# Patient Record
Sex: Male | Born: 1980 | Race: White | Hispanic: No | Marital: Married | Smoking: Current every day smoker
Health system: Southern US, Community
[De-identification: ages and names within clinical notes are randomized; demographics above are authoritative.]

## PROBLEM LIST (undated history)

## (undated) DIAGNOSIS — B019 Varicella without complication: Secondary | ICD-10-CM

## (undated) DIAGNOSIS — K921 Melena: Secondary | ICD-10-CM

## (undated) HISTORY — DX: Melena: K92.1

## (undated) HISTORY — DX: Varicella without complication: B01.9

---

## 2006-08-21 ENCOUNTER — Emergency Department (HOSPITAL_COMMUNITY): Admission: EM | Admit: 2006-08-21 | Discharge: 2006-08-21 | Payer: Self-pay | Admitting: Emergency Medicine

## 2006-08-21 ENCOUNTER — Ambulatory Visit (HOSPITAL_COMMUNITY): Admission: RE | Admit: 2006-08-21 | Discharge: 2006-08-21 | Payer: Self-pay | Admitting: Emergency Medicine

## 2008-02-21 IMAGING — CT CT UROGRAM
1 of 2 series · 15 of 32 positions shown, 20 images · non-contrast
Comparison: NONE

CLINICAL DATA: Hematuria. 

CT UROGRAM
TECHNIQUE: Thin-section unenhanced axial images were obtained to 
provide a CT urogram to evaluate for possible urinary tract stone. 
 Scan thicknesses were 3 mm with 3-mm increments.

[Series 2: wo · axial · 0.75mm/px · z∈[+1056,+1428]mm · 15 of 140 slices shown, 20 images]
[im 8/140  soft-tissue]
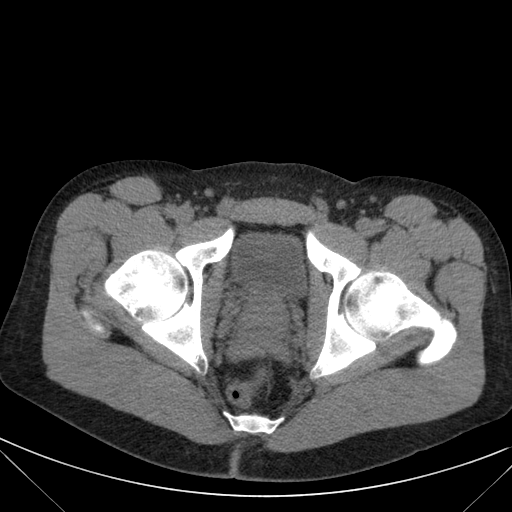
[im 8/140  bone]
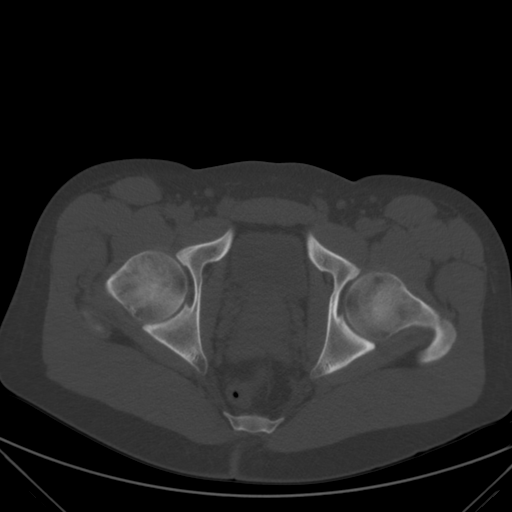
[im 15/140  soft-tissue]
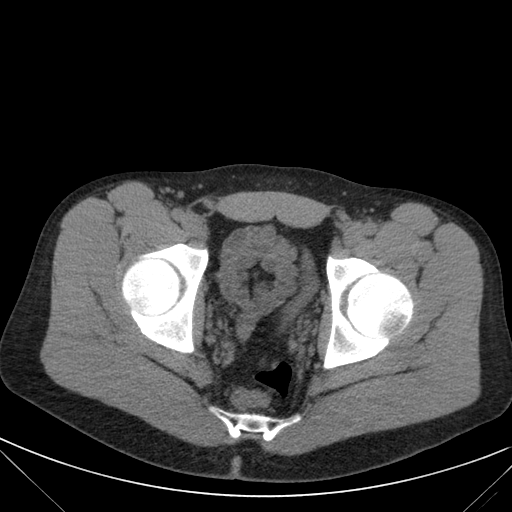
[im 30/140  soft-tissue]
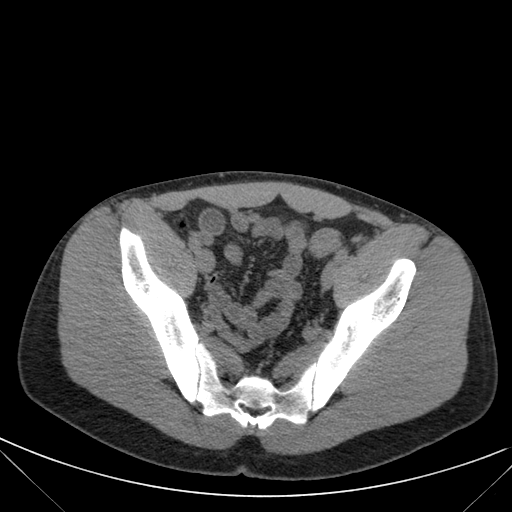
[im 37/140  soft-tissue]
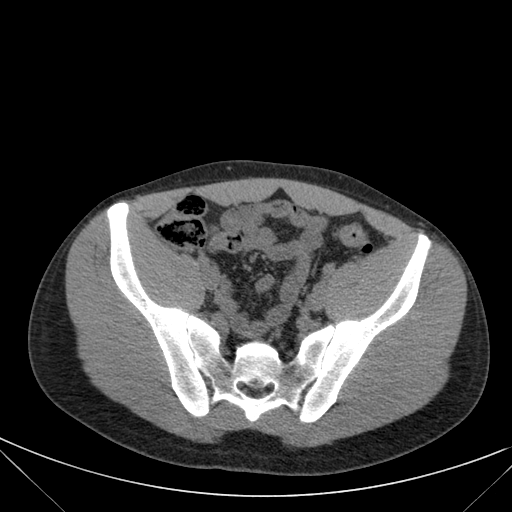
[im 44/140  soft-tissue]
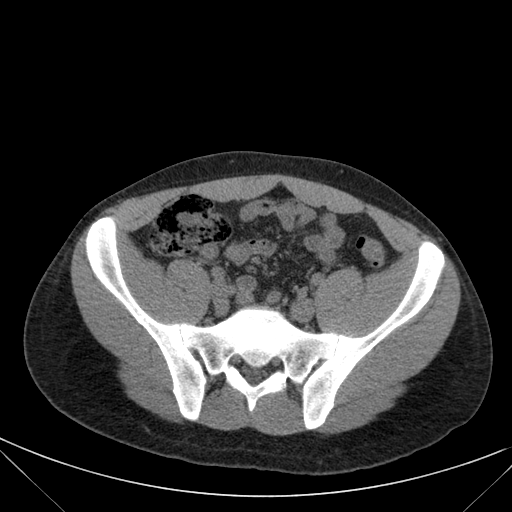
[im 59/140  soft-tissue]
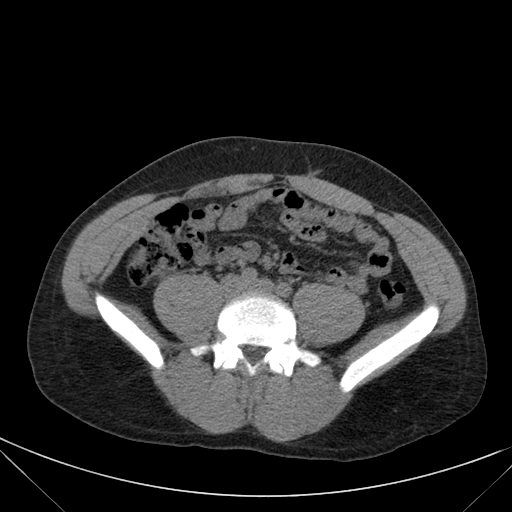
[im 66/140  soft-tissue]
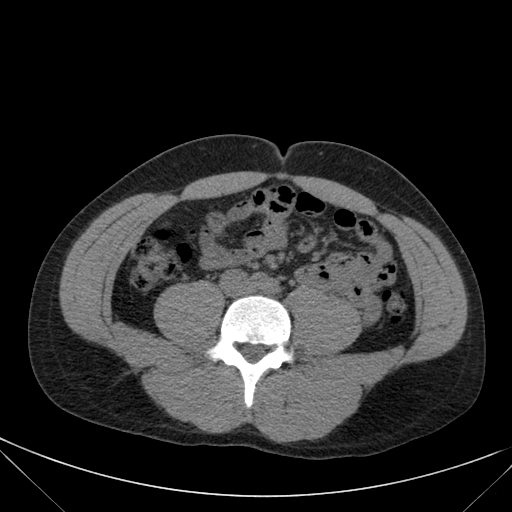
[im 74/140  soft-tissue]
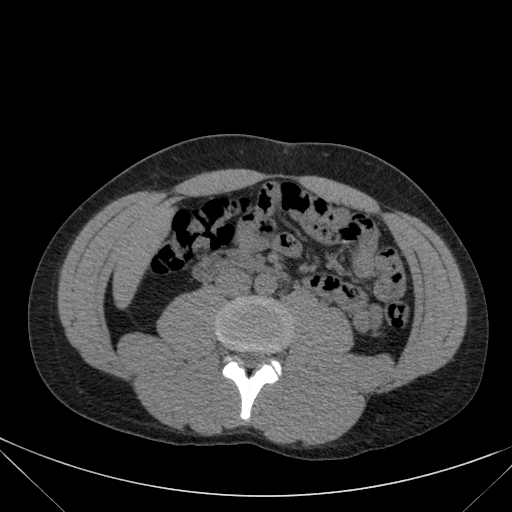
[im 81/140  soft-tissue]
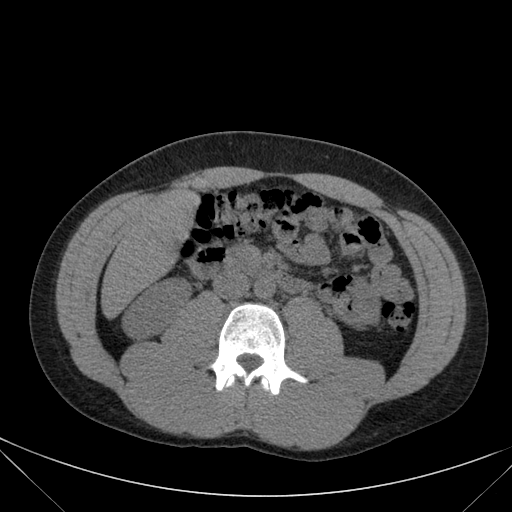
[im 81/140  bone]
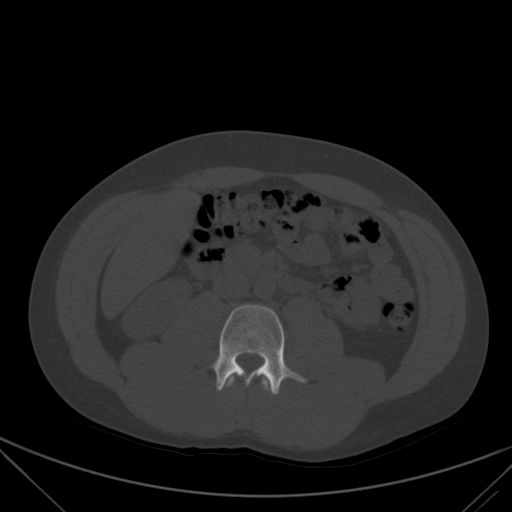
[im 96/140  soft-tissue]
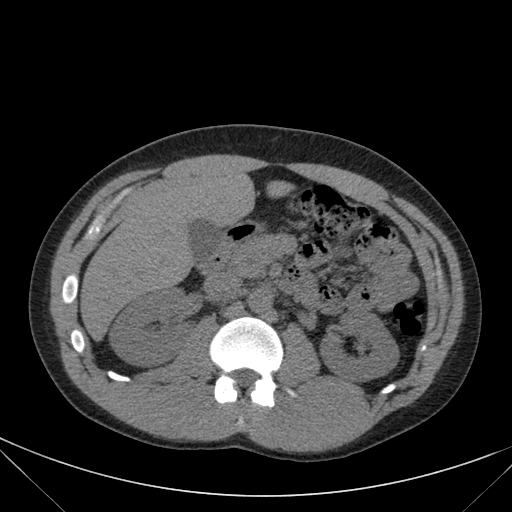
[im 103/140  soft-tissue]
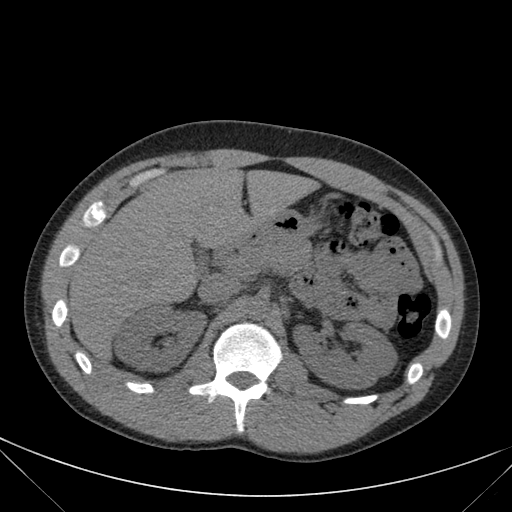
[im 110/140  soft-tissue]
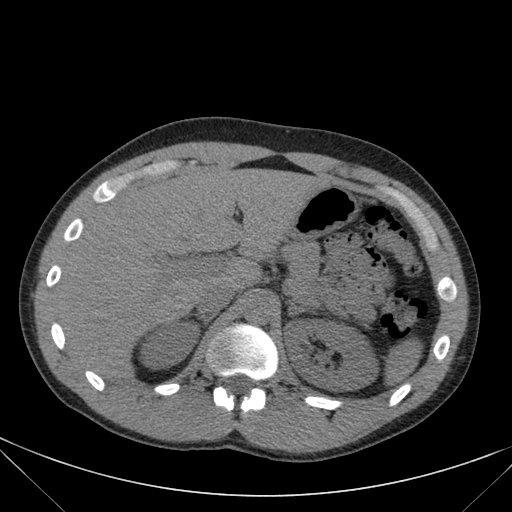
[im 110/140  lung]
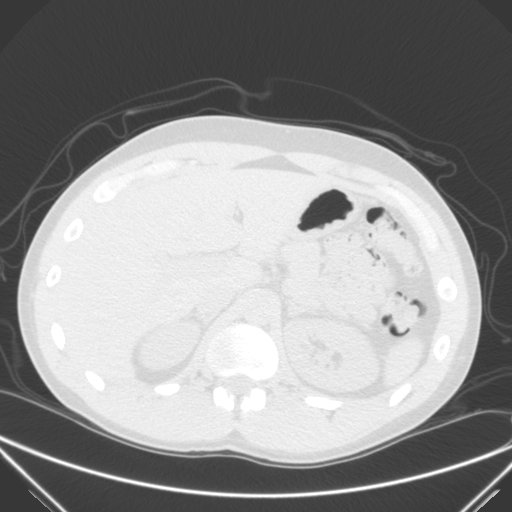
[im 118/140  lung]
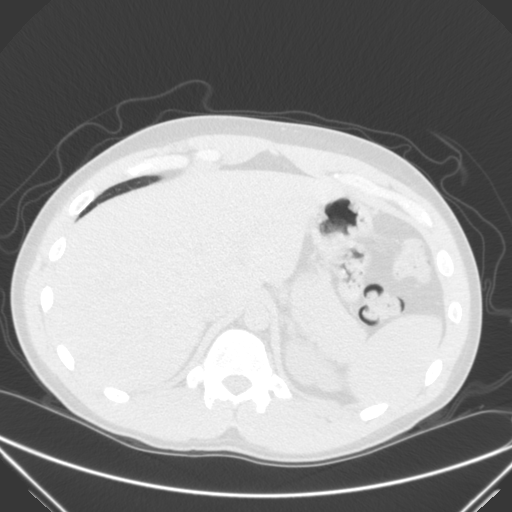
[im 125/140  soft-tissue]
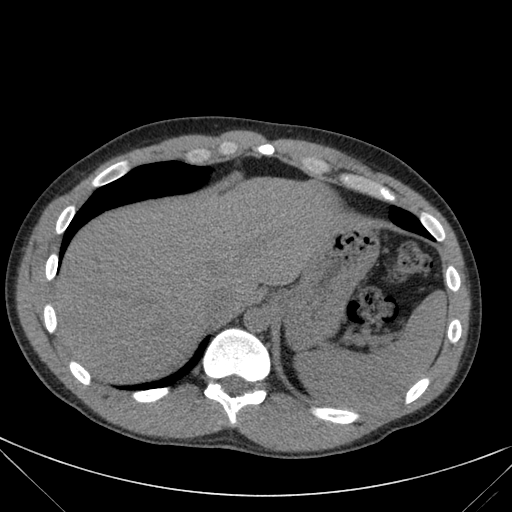
[im 125/140  lung]
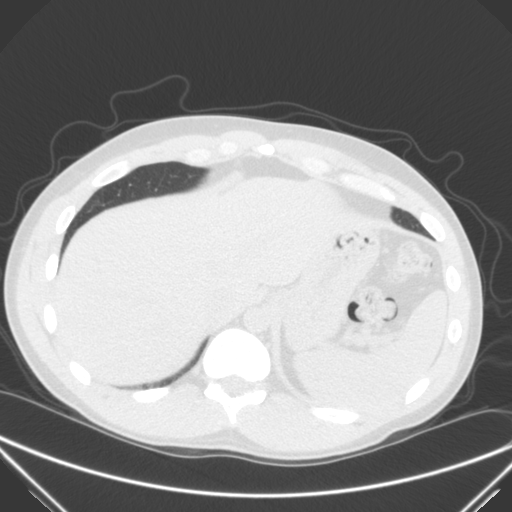
[im 132/140  soft-tissue]
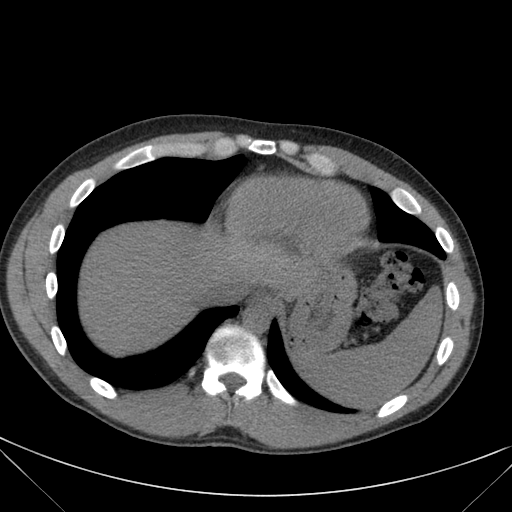
[im 132/140  lung]
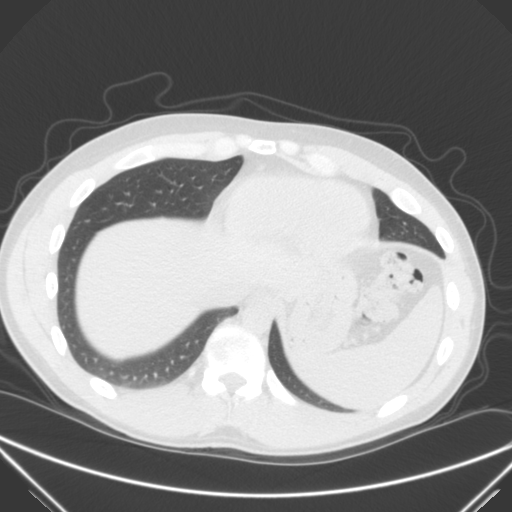

[15 of 32 positions shown; findings below may reference images not displayed]

FINDINGS: Kidneys are normal in size, location, and density.  The 
left kidney has no visible calculi.  The right kidney has a 3-4 
mm-long calculus in the renal pelvis, nonobstructing currently.  
There is also a 1-mm stone in the lower pole calyx on the right 
side.  No perinephric stranding or periureteric stranding or free 
fluid evident.  The bladder, seminal vesicles, and prostate are 
unremarkable.  Bowel and mesentery and aorta show nothing unusual.
IMPRESSION: Nonobstructing calculus in the right renal pelvis and 
lower pole calyx. Fattbardha Bagislar, M.D. Electronically 
[facility]  [facility]

## 2011-02-05 ENCOUNTER — Ambulatory Visit (INDEPENDENT_AMBULATORY_CARE_PROVIDER_SITE_OTHER): Payer: PRIVATE HEALTH INSURANCE

## 2011-02-05 ENCOUNTER — Inpatient Hospital Stay (INDEPENDENT_AMBULATORY_CARE_PROVIDER_SITE_OTHER)
Admission: RE | Admit: 2011-02-05 | Discharge: 2011-02-05 | Disposition: A | Discharge status: Home / Self Care | Payer: PRIVATE HEALTH INSURANCE | Source: Ambulatory Visit | Attending: Family Medicine | Admitting: Family Medicine

## 2011-02-05 DIAGNOSIS — M79609 Pain in unspecified limb: Secondary | ICD-10-CM

## 2011-12-08 IMAGING — CR DG CHEST 2V
2 series · 2 of 2 positions shown · non-contrast
Comparison: None.

CLINICAL DATA: Pain

CHEST - 2 VIEW

[view not recorded (1 of 2)]
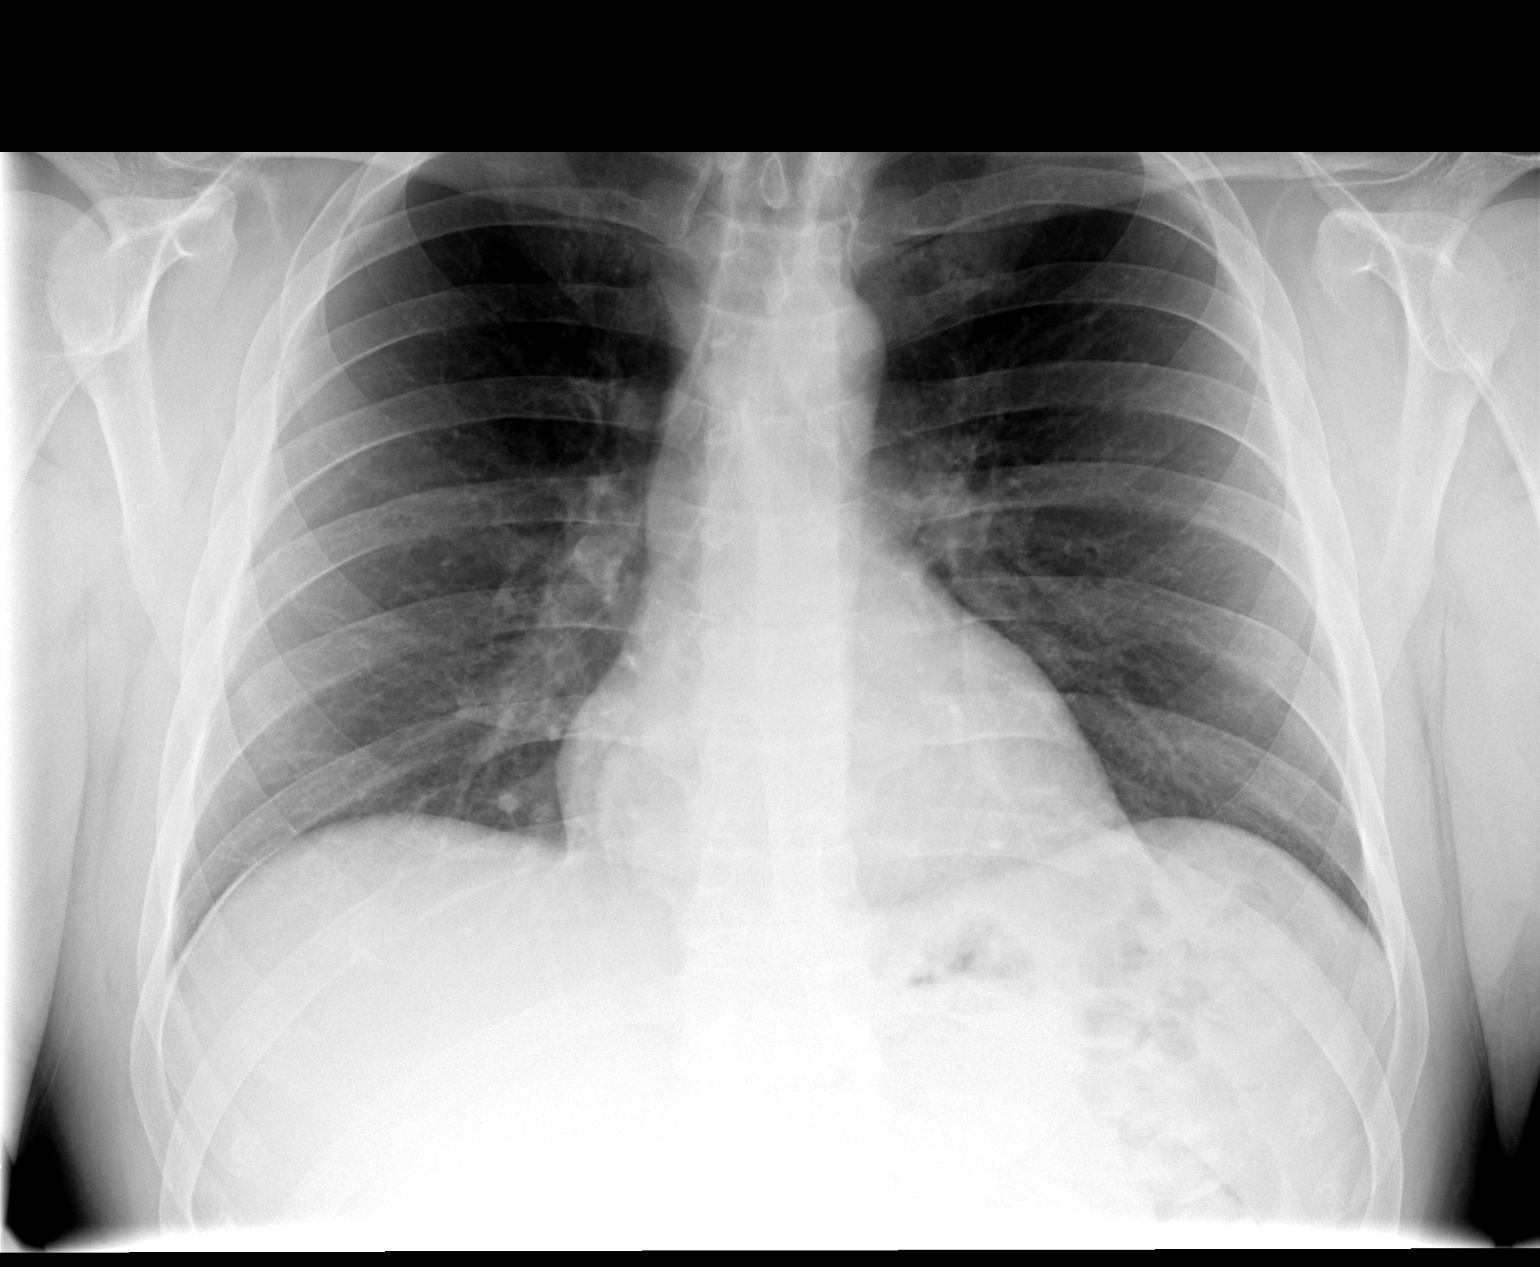

[view not recorded (2 of 2)]
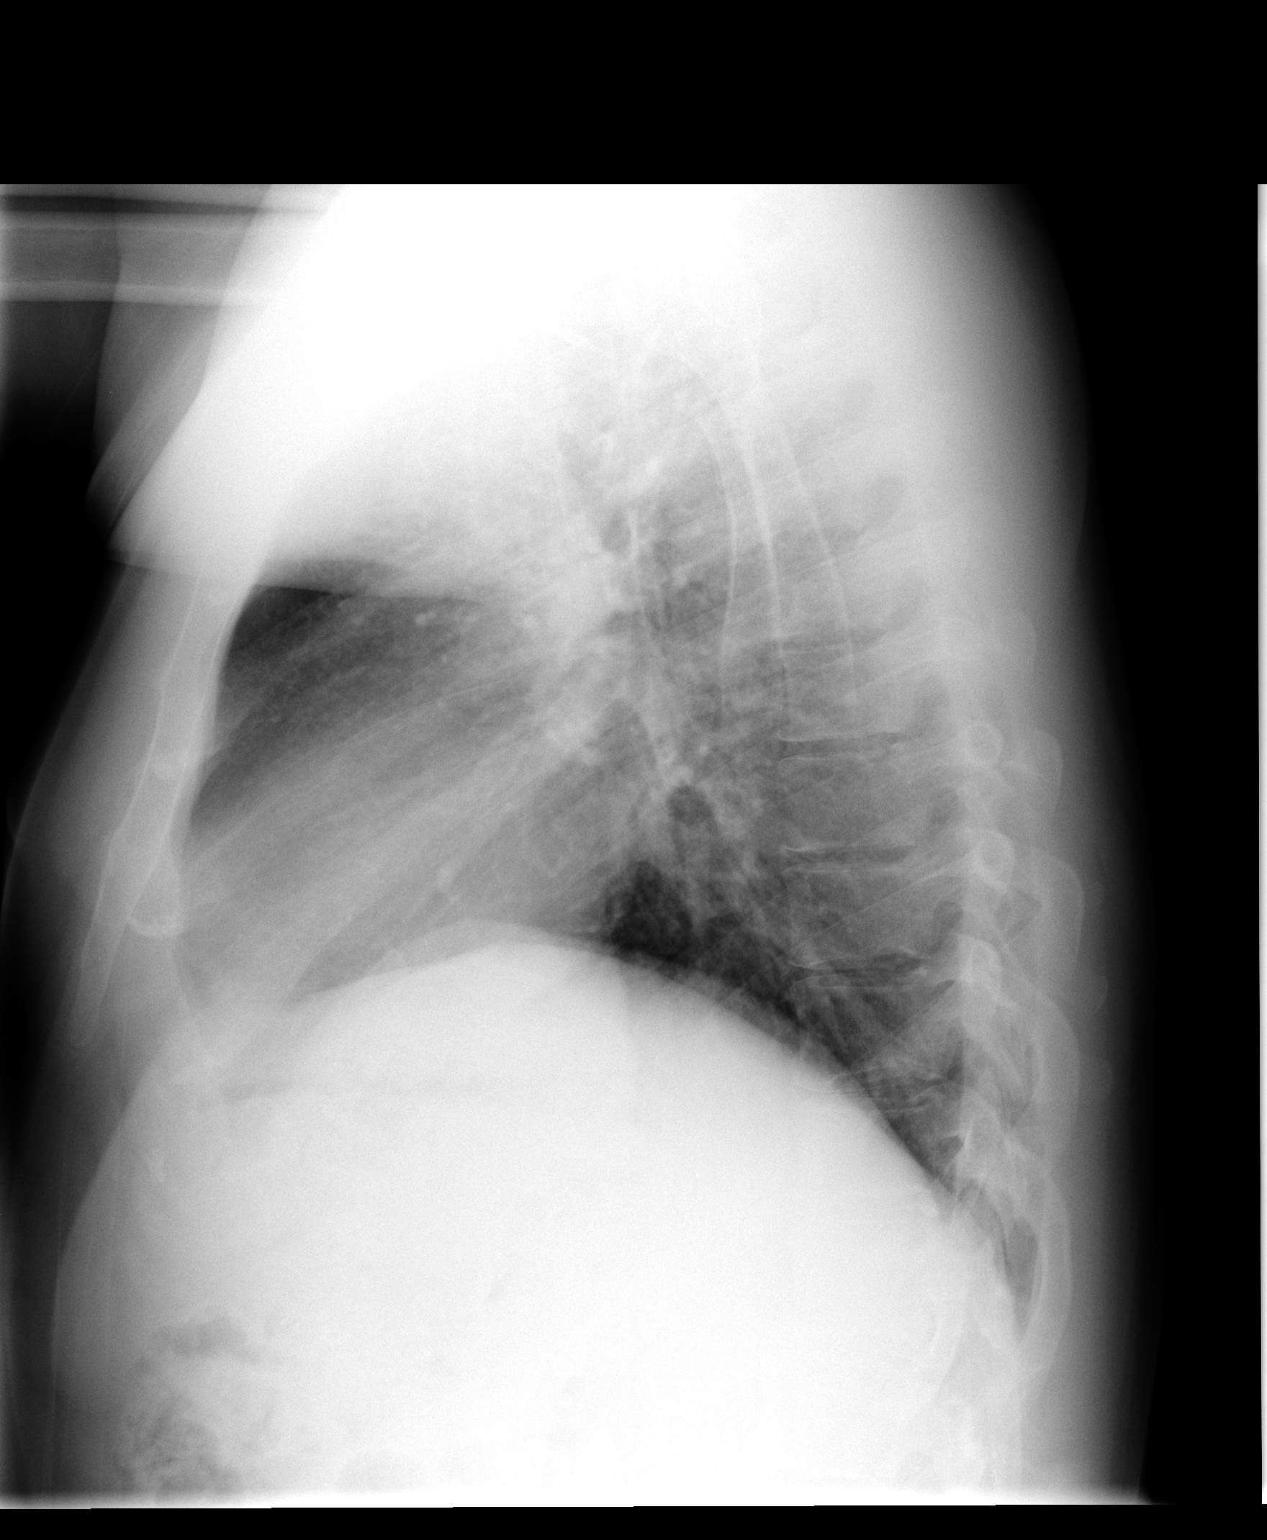

[2 of 2 positions shown; findings below may reference images not displayed]

FINDINGS: Cardiomediastinal silhouette is within normal limits. The
lungs are clear. No pleural effusion.  No pneumothorax.  No acute
osseous abnormality.
IMPRESSION: Normal chest.

## 2017-09-11 ENCOUNTER — Ambulatory Visit: Payer: BLUE CROSS/BLUE SHIELD | Admitting: Adult Health

## 2017-09-11 ENCOUNTER — Encounter: Payer: Self-pay | Admitting: Adult Health

## 2017-09-11 VITALS — BP 118/84 | Temp 98.1°F | Ht 72.0 in | Wt 220.0 lb

## 2017-09-11 DIAGNOSIS — Z3141 Encounter for fertility testing: Secondary | ICD-10-CM | POA: Diagnosis not present

## 2017-09-11 DIAGNOSIS — Z720 Tobacco use: Secondary | ICD-10-CM | POA: Diagnosis not present

## 2017-09-11 DIAGNOSIS — Z23 Encounter for immunization: Secondary | ICD-10-CM | POA: Diagnosis not present

## 2017-09-11 DIAGNOSIS — Z0000 Encounter for general adult medical examination without abnormal findings: Secondary | ICD-10-CM | POA: Diagnosis not present

## 2017-09-11 LAB — CBC WITH DIFFERENTIAL/PLATELET
BASOS ABS: 0 10*3/uL (ref 0.0–0.1)
Basophils Relative: 0.5 % (ref 0.0–3.0)
EOS PCT: 5.4 % — AB (ref 0.0–5.0)
Eosinophils Absolute: 0.4 10*3/uL (ref 0.0–0.7)
HCT: 48.5 % (ref 39.0–52.0)
HEMOGLOBIN: 15.8 g/dL (ref 13.0–17.0)
Lymphocytes Relative: 32.4 % (ref 12.0–46.0)
Lymphs Abs: 2.4 10*3/uL (ref 0.7–4.0)
MCHC: 32.7 g/dL (ref 30.0–36.0)
MCV: 94.4 fl (ref 78.0–100.0)
MONOS PCT: 8.7 % (ref 3.0–12.0)
Monocytes Absolute: 0.6 10*3/uL (ref 0.1–1.0)
Neutro Abs: 3.9 10*3/uL (ref 1.4–7.7)
Neutrophils Relative %: 53 % (ref 43.0–77.0)
Platelets: 284 10*3/uL (ref 150.0–400.0)
RBC: 5.14 Mil/uL (ref 4.22–5.81)
RDW: 12.6 % (ref 11.5–15.5)
WBC: 7.4 10*3/uL (ref 4.0–10.5)

## 2017-09-11 LAB — HEPATIC FUNCTION PANEL
ALBUMIN: 4.8 g/dL (ref 3.5–5.2)
ALK PHOS: 61 U/L (ref 39–117)
ALT: 16 U/L (ref 0–53)
AST: 15 U/L (ref 0–37)
BILIRUBIN TOTAL: 0.4 mg/dL (ref 0.2–1.2)
Bilirubin, Direct: 0.1 mg/dL (ref 0.0–0.3)
Total Protein: 7.4 g/dL (ref 6.0–8.3)

## 2017-09-11 LAB — BASIC METABOLIC PANEL
BUN: 15 mg/dL (ref 6–23)
CALCIUM: 9.4 mg/dL (ref 8.4–10.5)
CO2: 27 mEq/L (ref 19–32)
Chloride: 104 mEq/L (ref 96–112)
Creatinine, Ser: 0.84 mg/dL (ref 0.40–1.50)
GFR: 110.09 mL/min (ref >60.00)
GLUCOSE: 93 mg/dL (ref 70–99)
POTASSIUM: 4.4 meq/L (ref 3.5–5.1)
SODIUM: 142 meq/L (ref 135–145)

## 2017-09-11 LAB — TSH: TSH: 1.37 u[IU]/mL (ref 0.35–4.50)

## 2017-09-11 LAB — LDL CHOLESTEROL, DIRECT: LDL DIRECT: 102 mg/dL

## 2017-09-11 LAB — LIPID PANEL
Cholesterol: 173 mg/dL (ref 0–200)
HDL: 46.1 mg/dL (ref >39.00)
NONHDL: 126.68
Total CHOL/HDL Ratio: 4
Triglycerides: 265 mg/dL — ABNORMAL HIGH (ref 0.0–149.0)
VLDL: 53 mg/dL — AB (ref 0.0–40.0)

## 2017-09-11 LAB — HEMOGLOBIN A1C: Hgb A1c MFr Bld: 5.7 % (ref 4.6–6.5)

## 2017-09-11 MED ORDER — VARENICLINE TARTRATE 0.5 MG X 11 & 1 MG X 42 PO MISC: ORAL | 0 refills | Status: AC

## 2017-09-11 MED ORDER — TRIAMCINOLONE ACETONIDE 0.5 % EX CREA: 1.0000 "application " | TOPICAL_CREAM | Freq: Two times a day (BID) | CUTANEOUS | 3 refills | Status: AC

## 2017-09-11 NOTE — Patient Instructions (Signed)
It was great meeting you today   We will call you about your blood work   Someone from Urology will call you to schedule your visit   Please work on cutting back on alcohol consumption and smoking.   Follow up with me in one month after starting chantix

## 2017-09-11 NOTE — Progress Notes (Signed)
Patient presents to clinic today to establish care. He is a pleasant 37 year old male who  has a past medical history of Blood in stool and Chicken pox.   Acute Concerns: Establish Care/CPE   Chronic Issues: Alcohol use - drinks about 30 cans of beer a week.   Tobacco Use- smokes about 0.5 packs per day. He is interested in quitting smoking. Has not tried anything in the past   Fertility testing - he and his wife have been trying to get pregnant for one year without success. He would like to be tested.    Health Maintenance: Dental -- Starting Routine Care  Vision -- Does not do routine care  Immunizations --Needs Tetanus  Colonoscopy -- Never had  Diet: Has been eating grilled and sauteed foods.  Exercise: Does not exercise outside of work   History reviewed. No pertinent surgical history.  No current outpatient medications on file prior to visit.   No current facility-administered medications on file prior to visit.     No Known Allergies  Family History  Problem Relation Age of Onset  . Colon cancer Paternal Uncle   . Cystic fibrosis Sister   . Colon cancer Other        Paternal great grandfather     Social History   Socioeconomic History  . Marital status: Single    Spouse name: Not on file  . Number of children: Not on file  . Years of education: Not on file  . Highest education level: Not on file  Social Needs  . Financial resource strain: Not on file  . Food insecurity - worry: Not on file  . Food insecurity - inability: Not on file  . Transportation needs - medical: Not on file  . Transportation needs - non-medical: Not on file  Occupational History  . Not on file  Tobacco Use  . Smoking status: Current Every Day Smoker    Packs/day: 0.50    Types: Cigarettes  . Smokeless tobacco: Never Used  Substance and Sexual Activity  . Alcohol use: Yes    Alcohol/week: 18.0 oz    Types: 30 Cans of beer per week  . Drug use: No  . Sexual activity:  Not on file  Other Topics Concern  . Not on file  Social History Medical sales representative at [employer]    Married    No kids     Review of Systems  Constitutional: Negative.   HENT: Negative.   Eyes: Negative.   Respiratory: Negative.   Cardiovascular: Negative.   Gastrointestinal: Negative.   Genitourinary: Negative.   Musculoskeletal: Negative.   Skin: Negative.   Neurological: Negative.   Endo/Heme/Allergies: Negative.   Psychiatric/Behavioral: Negative.   All other systems reviewed and are negative.   BP 118/84 (BP Location: Left Arm)   Temp 98.1 F (36.7 C) (Oral)   Ht 6' (1.829 m)   Wt 220 lb (99.8 kg)   BMI 29.84 kg/m   Physical Exam  Constitutional: He is oriented to person, place, and time and well-developed, well-nourished, and in no distress. No distress.  HENT:  Head: Normocephalic and atraumatic.  Right Ear: External ear normal.  Left Ear: External ear normal.  Nose: Nose normal.  Mouth/Throat: Oropharynx is clear and moist. No oropharyngeal exudate.  Eyes: Conjunctivae and EOM are normal. Pupils are equal, round, and reactive to light. Right eye exhibits no discharge. Left eye exhibits no discharge. No scleral icterus.  Neck: Normal range of  motion. Neck supple. No JVD present. No tracheal deviation present. No thyromegaly present.  Cardiovascular: Normal rate, regular rhythm, normal heart sounds and intact distal pulses. Exam reveals no gallop and no friction rub.  No murmur heard. Pulmonary/Chest: Effort normal and breath sounds normal. No stridor. No respiratory distress. He has no wheezes. He has no rales. He exhibits no tenderness.  Abdominal: Soft. Bowel sounds are normal. He exhibits no distension and no mass. There is no tenderness. There is no rebound and no guarding.  Musculoskeletal: Normal range of motion. He exhibits no edema, tenderness or deformity.  Lymphadenopathy:    He has no cervical adenopathy.  Neurological: He is alert and oriented to  person, place, and time. He displays normal reflexes. No cranial nerve deficit. He exhibits normal muscle tone. Gait normal. Coordination normal. GCS score is 15.  Skin: Skin is warm and dry. No rash noted. He is not diaphoretic. No erythema. No pallor.  Psychiatric: Mood, memory, affect and judgment normal.  Nursing note and vitals reviewed.  Assessment/Plan: 1. Tobacco use - varenicline (CHANTIX STARTING MONTH PAK) 0.5 MG X 11 & 1 MG X 42 tablet; Take one 0.5 mg tablet by mouth once daily for 3 days, then increase to one 0.5 mg tablet twice daily for 4 days, then increase to one 1 mg tablet twice daily.  Dispense: 53 tablet; Refill: 0  Trial of chantix. Common side effects including rare risk of suicide ideation was discussed with the patient today.  Patient is instructed to go directly to the ED if this occurs.  We discussed that patient can continue to smoke for 1 week after starting chantix, but then must discontinue cigarettes.  He is also instructed to contact us prior to completion of the starter month pack for an rx for the continuation month pack.  5 minutes spent with patient today on tobacco cessation counseling.    2. Routine general medical examination at a health care facility  - Basic metabolic panel - CBC with Differential/Platelet - Hepatic function panel - Hemoglobin A1c - Lipid panel - TSH  3. Fertility testing - Encouraged to quit smoking and quit drinking as both of these can cause decreased sperm counts  - Basic metabolic panel - CBC with Differential/Platelet - Hepatic function panel - Hemoglobin A1c - Lipid panel - TSH - Ambulatory referral to Urology  4. Need for tetanus booster  - Td : Tetanus/diphtheria >7yo Preservative  free   Shirline Freesory Aradia Estey, NP

## 2017-09-27 DIAGNOSIS — Z3141 Encounter for fertility testing: Secondary | ICD-10-CM | POA: Diagnosis not present

## 2017-10-15 ENCOUNTER — Ambulatory Visit: Payer: BLUE CROSS/BLUE SHIELD | Admitting: Adult Health

## 2017-12-03 DIAGNOSIS — M7752 Other enthesopathy of left foot: Secondary | ICD-10-CM | POA: Diagnosis not present

## 2017-12-03 DIAGNOSIS — M21622 Bunionette of left foot: Secondary | ICD-10-CM | POA: Diagnosis not present

## 2017-12-03 DIAGNOSIS — M71572 Other bursitis, not elsewhere classified, left ankle and foot: Secondary | ICD-10-CM | POA: Diagnosis not present

## 2018-10-17 DIAGNOSIS — Z3189 Encounter for other procreative management: Secondary | ICD-10-CM | POA: Diagnosis not present

## 2019-05-13 ENCOUNTER — Encounter: Payer: BLUE CROSS/BLUE SHIELD | Admitting: Adult Health

## 2022-11-26 ENCOUNTER — Ambulatory Visit
Admission: EM | Admit: 2022-11-26 | Discharge: 2022-11-26 | Disposition: A | Discharge status: Home / Self Care | Payer: BC Managed Care – PPO | Attending: Internal Medicine | Admitting: Internal Medicine

## 2022-11-26 DIAGNOSIS — R1310 Dysphagia, unspecified: Secondary | ICD-10-CM | POA: Diagnosis not present

## 2022-11-26 DIAGNOSIS — R198 Other specified symptoms and signs involving the digestive system and abdomen: Secondary | ICD-10-CM | POA: Diagnosis not present

## 2022-11-26 DIAGNOSIS — K921 Melena: Secondary | ICD-10-CM

## 2022-11-26 LAB — POC HEMOCCULT BLD/STL (OFFICE/1-CARD/DIAGNOSTIC): Fecal Occult Blood, POC: POSITIVE — AB

## 2022-11-26 MED ORDER — HYDROCORTISONE ACETATE 25 MG RE SUPP: 25.0000 mg | Freq: Two times a day (BID) | RECTAL | 0 refills | Status: AC

## 2022-11-26 MED ORDER — POLYETHYLENE GLYCOL 3350 17 G PO PACK: 17.0000 g | PACK | Freq: Every day | ORAL | 0 refills | Status: AC | PRN

## 2022-11-26 NOTE — Discharge Instructions (Addendum)
You have been prescribed Anusol for hemorrhoids. This is a medication that is inserted rectally and is often used to treat hemorrhoids. Please take as directed. You were also prescribed Miralax as needed for constipation.   Please noted our office is limited in the tests and imaging we can perform at our facility. Your evaluation was not suggestive of any emergent condition requiring medical intervention at this time. However, some abdominal problems make take more time to appear. Therefore, it is very important for you to pay attention to any new symptoms or worsening of your current condition.   Please go directly to the Emergency Department immediately should you begin to feel worse in any way or have any of the following symptoms: increasing or different abdominal pain, persistent vomiting, inability to drink fluids, fevers or begin vomiting blood.

## 2022-11-26 NOTE — ED Provider Notes (Signed)
[facility]  Note:  This document was prepared using Dragon voice recognition software and may include unintentional dictation errors.  MRN: [id number] DOB: 28-Jun-1981 DATE: 11/26/22   Subjective:  Chief Complaint:  Chief Complaint  Patient presents with   Rectal Bleeding     HPI: Benjamin Underwood is a 42 y.o. male presenting for rectal bleeding for one day. Patient states this morning he had a bowel movement and noted blood with wiping and in the toilet. He states he was straining this morning when defecating, but had no pain with the bloody bowel movement. He has felt what he called a "bump" near his anus. He states the bump is tender and he is able to push it in and out. He only had one episode prior in the past and it was a few weeks ago, but less blood at that time. He does report straining on the toilet at times. Prior to this morning, last bowel movement was last night and normal for him. He states he has about 3-4 bowel movements a day. No prior colonoscopy. He does have a history of dysphagia for the past four months and per his chart was referred to GI, but never followed up. Dysphagia occurs with solids only and he feels as if food get stuck in his throat. He reports having to work solids down into his stomach. Denies fever, abdominal pain, nausea/vomiting, diarrhea, SOB, dizziness. Endorses hard time swallowing and painless rectal bleeding. Presents NAD.  Prior to Admission medications   Medication Sig Start Date End Date Taking? Authorizing Provider  triamcinolone cream (KENALOG) 0.5 % Apply 1 application topically 2 (two) times daily. 09/11/17   Nafziger, Tommi Rumps, NP  varenicline (CHANTIX STARTING MONTH PAK) 0.5 MG X 11 & 1 MG X 42 tablet Take one 0.5 mg tablet by mouth once daily for 3 days, then increase to one 0.5 mg tablet twice daily for 4 days, then increase to one 1 mg tablet twice daily. 09/11/17   Nafziger, Tommi Rumps, NP     No Known Allergies  History:   Past  Medical History:  Diagnosis Date   Blood in stool    Chicken pox      History reviewed. No pertinent surgical history.  Family History  Problem Relation Age of Onset   Colon cancer Paternal Uncle    Cystic fibrosis Sister    Colon cancer Other        Paternal great grandfather     Social History   Tobacco Use   Smoking status: Every Day    Packs/day: .5    Types: Cigarettes   Smokeless tobacco: Never  Vaping Use   Vaping Use: Never used  Substance Use Topics   Alcohol use: Yes    Alcohol/week: 30.0 standard drinks of alcohol    Types: 30 Cans of beer per week   Drug use: No    Review of Systems  Constitutional:  Negative for fever.  Respiratory:  Negative for shortness of breath.   Gastrointestinal:  Positive for blood in stool. Negative for abdominal pain, diarrhea, nausea, rectal pain and vomiting.  Neurological:  Negative for dizziness, syncope and light-headedness.     Objective:   Vitals: BP 135/80 (BP Location: Right Arm)   Pulse 98   Temp 97.6 F (36.4 C) (Oral)   Resp 18   SpO2 96%   Physical Exam Constitutional:      General: He is not in acute distress.    Appearance: Normal appearance.  He is well-developed and normal weight. He is not ill-appearing or toxic-appearing.  HENT:     Head: Normocephalic and atraumatic.  Cardiovascular:     Rate and Rhythm: Normal rate and regular rhythm.     Heart sounds: Normal heart sounds.  Pulmonary:     Effort: Pulmonary effort is normal.     Breath sounds: Normal breath sounds.     Comments: Clear to auscultation bilaterally  Abdominal:     General: Bowel sounds are normal.     Palpations: Abdomen is soft.     Tenderness: There is no abdominal tenderness.  Genitourinary:    Rectum: Tenderness and internal hemorrhoid present.     Comments: Irritation noted around anus. Positive hemoccult. Skin:    General: Skin is warm and dry.  Neurological:     General: No focal deficit present.     Mental Status:  He is alert.  Psychiatric:        Mood and Affect: Mood and affect normal.     Results:  Labs: Results for orders placed or performed during the hospital encounter of 11/26/22 (from the past 24 hour(s))  POC Hemoccult Bld/Stl (1-Cd Office Dx)     Status: Abnormal   Collection Time: 11/26/22  9:38 AM  Result Value Ref Range   Card #1 Date 11/26/2022    Fecal Occult Blood, POC Positive (A) Negative    Radiology: No results found.   UC Course/Treatments:  Procedures: Procedures   Medications Ordered in UC: Medications - No data to display   Assessment and Plan :     ICD-10-CM   1. Blood in stool  K92.1     2. Dysphagia, unspecified type  R13.10     3. Straining during bowel movements  R19.8      Blood in Stool: Afebrile, nontoxic-appearing, NAD. VSS. DDX includes but not limited to: Hemorrhoids, cancer, anal fissure Hemoccult was positive today in office. Suspect hemorrhoids secondary to straining given patient's description of the lesion around his anus and his physical exam today in office. Anusol was prescribed for hemorrhoids as well as Miralax to help with straining during defecation. Increase water and fiber in take. Follow up with GI if no improvement. Strict ED precautions were given and patient verbalized understanding.  Dysphagia: Afebrile, nontoxic-appearing, NAD. VSS. DDX includes but not limited to: stricture, esophageal cancer, GERD Recommend patient follow up with GI for additional testing. Strict ED precautions were given and patient verbalized understanding.    ED Discharge Orders          Ordered    hydrocortisone (ANUSOL-HC) 25 MG suppository  2 times daily        11/26/22 0950    polyethylene glycol (MIRALAX) 17 g packet  Daily PRN        11/26/22 0950             PDMP not reviewed this encounter.     Legacie Dillingham P, PA-C 11/26/22 1006

## 2022-11-26 NOTE — ED Notes (Signed)
In with Axel Filler, PA for chaperone.

## 2022-11-26 NOTE — ED Triage Notes (Signed)
Pt c/o of bright red blood in stool today. No dizziness or shortness of breath.  Noticed upper left chest pain intermittently x 2 weeks. Pt c/o issues swallowing dryer solid foods, feels like it gets caught in mid chest x 4 months. Reports no issues with liquids.

## 2022-11-27 ENCOUNTER — Telehealth: Payer: Self-pay

## 2022-11-27 NOTE — Telephone Encounter (Signed)
TCT pt to follow up from recent visit. Pt denies any additional needs at this time.  

## 2023-05-03 ENCOUNTER — Ambulatory Visit
Admission: RE | Admit: 2023-05-03 | Discharge: 2023-05-03 | Disposition: A | Discharge status: Home / Self Care | Payer: BC Managed Care – PPO | Source: Ambulatory Visit

## 2023-05-03 VITALS — BP 145/92 | HR 87 | Temp 98.8°F | Resp 18

## 2023-05-03 DIAGNOSIS — K921 Melena: Secondary | ICD-10-CM | POA: Diagnosis not present

## 2023-05-03 DIAGNOSIS — K625 Hemorrhage of anus and rectum: Secondary | ICD-10-CM

## 2023-05-03 LAB — POC HEMOCCULT BLD/STL (OFFICE/1-CARD/DIAGNOSTIC): Fecal Occult Blood, POC: NEGATIVE

## 2023-05-03 MED ORDER — HYDROCORTISONE (PERIANAL) 2.5 % EX CREA: 1.0000 | TOPICAL_CREAM | Freq: Two times a day (BID) | CUTANEOUS | 0 refills | Status: AC

## 2023-05-03 MED ORDER — HYDROCORTISONE ACETATE 25 MG RE SUPP: 25.0000 mg | Freq: Two times a day (BID) | RECTAL | 0 refills | Status: AC

## 2023-05-03 NOTE — Discharge Instructions (Signed)
I have sent you two prescriptions to help with the bleeding. If you want to try the cream first and then try the suppositories that is fine. Please take as directed. If insurance does not pay for them, ask the pharmacist for the over the counter equivalent.   I also encourage you increase your fiber and water intake. If no improvement, I recommend you follow up with your PCP.

## 2023-05-03 NOTE — ED Provider Notes (Signed)
[facility]  Note:  This document was prepared using Dragon voice recognition software and may include unintentional dictation errors.  MRN: [id number] DOB: December 27, 1980 DATE: 05/03/23   Subjective:  Chief Complaint:  Chief Complaint  Patient presents with   Rectal Bleeding     HPI: Benjamin Underwood is a 42 y.o. male presenting for blood in his stool for 3 days. Patient was seen in March 2024 for the same issue. At that time, he reported having blood in his stool for the first time and was very concerned. He was diagnosed with an internal hemorrhoid at that time. He was prescribed Anusol as well as Miralax due to straining with defecation. He reports the Anusol being not covered by insurance, so he did not purchase them. However, his symptoms resolved and the Anusol was not needed. He has not had any issues until Wednesday of this week. Reports noticing blood in the toliet after a bowel movement. He did have some straining at that time, but no pain. He reports no more episodes until approximately 2 hours ago. He states that he had a bowel movement and noticed some blood with wiping. Reports no pain with this bowel movement either. Denies fever, nausea/vomiting, diarrhea, abdominal pain, dysuria. Endorses rectal bleeding. Presents NAD.  Prior to Admission medications   Medication Sig Start Date End Date Taking? Authorizing Provider  predniSONE (DELTASONE) 20 MG tablet Take 3 tablets once daily x 3 days then take 2 tablets once daily x 3 days then take 1 tablet once daily x 3 days.  Take on a full stomach. 04/29/23  Yes [provider]  triamcinolone cream (KENALOG) 0.1 % Apply to rash affected areas 2-3 times daily. 04/29/23  Yes [provider]  polyethylene glycol (MIRALAX) 17 g packet Take 17 g by mouth daily as needed for moderate constipation. Max: 1 capful or pkt/day. Dissolve in 4-8ox of liquid. May take 1-3 days to produce BM. Use shortest effective treatment  duration 11/26/22   Osaze Hubbert P, PA-C  triamcinolone cream (KENALOG) 0.5 % Apply 1 application topically 2 (two) times daily. 09/11/17   Nafziger, Kandee Keen, NP  varenicline (CHANTIX STARTING MONTH PAK) 0.5 MG X 11 & 1 MG X 42 tablet Take one 0.5 mg tablet by mouth once daily for 3 days, then increase to one 0.5 mg tablet twice daily for 4 days, then increase to one 1 mg tablet twice daily. 09/11/17   Nafziger, Kandee Keen, NP     No Known Allergies  History:   Past Medical History:  Diagnosis Date   Blood in stool    Chicken pox      History reviewed. No pertinent surgical history.  Family History  Problem Relation Age of Onset   Colon cancer Paternal Uncle    Cystic fibrosis Sister    Colon cancer Other        Paternal great grandfather     Social History   Tobacco Use   Smoking status: Every Day    Current packs/day: 0.50    Types: Cigarettes   Smokeless tobacco: Never  Vaping Use   Vaping status: Never Used  Substance Use Topics   Alcohol use: Yes    Alcohol/week: 30.0 standard drinks of alcohol    Types: 30 Cans of beer per week   Drug use: No    Review of Systems  Constitutional:  Negative for fatigue and fever.  Respiratory:  Negative for shortness of breath.   Gastrointestinal:  Positive for  anal bleeding and blood in stool. Negative for abdominal pain, nausea, rectal pain and vomiting.  Genitourinary:  Negative for dysuria.  Neurological:  Negative for dizziness and light-headedness.     Objective:   Vitals: BP (!) 145/92 (BP Location: Right Arm)   Pulse 87   Temp 98.8 F (37.1 C) (Oral)   Resp 18   SpO2 96%   Physical Exam Constitutional:      General: He is not in acute distress.    Appearance: Normal appearance. He is well-developed and normal weight. He is not ill-appearing or toxic-appearing.  HENT:     Head: Normocephalic and atraumatic.  Cardiovascular:     Rate and Rhythm: Normal rate and regular rhythm.     Heart sounds: Normal heart sounds.   Pulmonary:     Effort: Pulmonary effort is normal.     Breath sounds: Normal breath sounds.     Comments: Clear to auscultation bilaterally  Abdominal:     General: Bowel sounds are normal.     Palpations: Abdomen is soft.     Tenderness: There is no abdominal tenderness. There is no right CVA tenderness or left CVA tenderness.  Genitourinary:    Rectum: Guaiac result negative. No mass.       Comments: Irritation noted around anus. Small area of skin breakdown that appears to have had prior bleeding. No signs of vesicular lesions. Skin:    General: Skin is warm and dry.     Findings: Abrasion present.  Neurological:     General: No focal deficit present.     Mental Status: He is alert.  Psychiatric:        Mood and Affect: Mood and affect normal.     Results:  Labs: Results for orders placed or performed during the hospital encounter of 05/03/23 (from the past 24 hour(s))  POC Hemoccult Bld/Stl (1-Cd Office Dx)     Status: None   Collection Time: 05/03/23  4:53 PM  Result Value Ref Range   Card #1 Date 05/03/2023    Fecal Occult Blood, POC Negative Negative    Radiology: No results found.   UC Course/Treatments:  Procedures: Procedures   Medications Ordered in UC: Medications - No data to display   Assessment and Plan :     ICD-10-CM   1. Rectal bleeding  K62.5       Rectal Bleeding Afebrile, nontoxic-appearing, NAD. VSS. DDX includes but not limited to: fissure, hemorrhoid, abrasion, ulceration, HSV No hemorrhoids noted on exam; however, there is an area of skin breakdown. Appears similar to abrasion versus ulceration. Also, appears to be an area of prior bleeding. Suspect this is the cause of his symptoms. Recommend treatment with hydrocortisone rectal cream 2.5% BID. Questioning if he is developing a fissure or fistula. Hydrocortisone 25mg  suppositories also prescribed for future use if needed. If no improvement, recommend follow up with his PCP. Recommend  he increase his fiber and water intake to help with straining during defecation. Strict ED precautions were given and patient verbalized understanding.  ED Discharge Orders          Ordered    hydrocortisone (ANUSOL-HC) 2.5 % rectal cream  2 times daily        05/03/23 1701    hydrocortisone (ANUSOL-HC) 25 MG suppository  2 times daily        05/03/23 1701             PDMP not reviewed this encounter.     Roben Tatsch,  Rasool Rommel P, PA-C 05/03/23 1715

## 2023-05-03 NOTE — ED Notes (Signed)
In with Ashlee Hermanns, PA for chaperone. 

## 2023-05-03 NOTE — ED Triage Notes (Signed)
Pt c/o blood in stool, pt states he did not fill hemorrhoid medication, Wednesday am blood in stool and today BM without straining and bright red blood on tissue with wiping Denies: abd/back pain, dizziness, fatigue or SOB
